# Patient Record
Sex: Female | Born: 1995 | Race: Black or African American | Hispanic: No | Marital: Single | Smoking: Never smoker
Health system: Southern US, Community
[De-identification: ages and names within clinical notes are randomized; demographics above are authoritative.]

---
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
## 2017-12-11 ENCOUNTER — Ambulatory Visit: Payer: Self-pay | Admitting: Pediatrics

## 2017-12-11 ENCOUNTER — Encounter: Payer: Self-pay | Admitting: Licensed Clinical Social Worker

## 2017-12-11 NOTE — BH Specialist Note (Deleted)
Integrated Behavioral Health Initial Visit  MRN: [id number] Name: Yolanda Riley  Number of Integrated Behavioral Health Clinician visits:: 1/6 Session Start time: ***  Session End time: *** Total time: {IBH Total Time:21014050}  Type of Service: Integrated Behavioral Health- Individual/Family Interpretor:No. Interpretor Name and Language: N/A   Warm Hand Off Completed.       SUBJECTIVE: Yolanda Riley is a 22 y.o. female accompanied by {CHL AMB ACCOMPANIED WU:[id number]}BY:[id number]} Patient was referred by *** for ***. Patient reports the following symptoms/concerns: *** Duration of problem: ***; Severity of problem: {Mild/Moderate/Severe:20260}  OBJECTIVE: Mood: {BHH MOOD:22306} and Affect: {BHH AFFECT:22307} Risk of harm to self or others: {CHL AMB BH Suicide Current Mental Status:21022748}  LIFE CONTEXT: Family and Social: *** School/Work: *** Self-Care: *** Life Changes: ***  GOALS ADDRESSED: Patient will: 1. Reduce symptoms of: {IBH Symptoms:21014056} 2. Increase knowledge and/or ability of: {IBH Patient Tools:21014057}  3. Demonstrate ability to: {IBH Goals:21014053}  INTERVENTIONS: Interventions utilized: {IBH Interventions:21014054}  Standardized Assessments completed: {IBH Screening Tools:21014051}  ASSESSMENT: Patient currently experiencing ***.   Patient may benefit from ***.  PLAN: 1. Follow up with behavioral health clinician on : *** 2. Behavioral recommendations: *** 3. Referral(s): {IBH Referrals:21014055} 4. "From scale of 1-10, how likely are you to follow plan?": ***  Tarynn Garling P Jarrid Lienhard, LCSWA

## 2020-10-14 ENCOUNTER — Other Ambulatory Visit (HOSPITAL_COMMUNITY)
Admission: RE | Admit: 2020-10-14 | Discharge: 2020-10-14 | Disposition: A | Discharge status: Home / Self Care | Payer: BC Managed Care – PPO | Source: Ambulatory Visit | Attending: Obstetrics and Gynecology | Admitting: Obstetrics and Gynecology

## 2020-10-14 ENCOUNTER — Other Ambulatory Visit: Payer: Self-pay

## 2020-10-14 ENCOUNTER — Ambulatory Visit (INDEPENDENT_AMBULATORY_CARE_PROVIDER_SITE_OTHER): Payer: BC Managed Care – PPO | Admitting: Obstetrics and Gynecology

## 2020-10-14 ENCOUNTER — Encounter: Payer: Self-pay | Admitting: Obstetrics and Gynecology

## 2020-10-14 VITALS — BP 127/88 | HR 94 | Ht 63.0 in | Wt 118.0 lb

## 2020-10-14 DIAGNOSIS — Z01419 Encounter for gynecological examination (general) (routine) without abnormal findings: Secondary | ICD-10-CM

## 2020-10-14 NOTE — Progress Notes (Signed)
GYNECOLOGY ANNUAL PREVENTATIVE CARE ENCOUNTER NOTE  History:     Yolanda Riley is a 24 y.o. G0P0000 female here for a routine annual gynecologic exam.  Current complaints: none, not sexually active.   Denies abnormal vaginal bleeding, discharge, pelvic pain, problems with intercourse or other gynecologic concerns.   Pronouns: She/her Sex with Female.   Gynecologic History Patient's last menstrual period was 10/11/2020. Contraception: none Last Pap: NA.    Obstetric History OB History  Gravida Para Term Preterm AB Living  0 0 0 0 0 0  SAB TAB Ectopic Multiple Live Births  0 0 0 0 0    History reviewed. No pertinent past medical history.  History reviewed. No pertinent surgical history.  Current Outpatient Medications on File Prior to Visit  Medication Sig Dispense Refill  . loratadine (CLARITIN) 10 MG tablet Take one tablet daily if needed for allergy symptoms and congestion (Patient not taking: Reported on 10/14/2020) 30 tablet 12   No current facility-administered medications on file prior to visit.    No Known Allergies  Social History:  reports that she is a non-smoker but has been exposed to tobacco smoke. She has never used smokeless tobacco. She reports current alcohol use. She reports current drug use. Drug: Marijuana.  Family History  Problem Relation Age of Onset  . Hypertension Mother   . Lupus Paternal Aunt   . Lupus Paternal Grandmother   . Diabetes Neg Hx   . Mental illness Neg Hx     The following portions of the patient's history were reviewed and updated as appropriate: allergies, current medications, past family history, past medical history, past social history, past surgical history and problem list.  Review of Systems Pertinent items noted in HPI and remainder of comprehensive ROS otherwise negative.  Physical Exam:  BP 127/88   Pulse 94   Ht 5\' 3"  (1.6 m)   Wt 118 lb (53.5 kg)   LMP 10/11/2020   BMI 20.90 kg/m  CONSTITUTIONAL:  Well-developed, well-nourished female in no acute distress.  HENT:  Normocephalic, atraumatic, External right and left ear normal.  EYES: Conjunctivae and EOM are normal. Pupils are equal, round, and reactive to light. No scleral icterus.  NECK: Normal range of motion, supple, no masses.  Normal thyroid.  SKIN: Skin is warm and dry. No rash noted. Not diaphoretic. No erythema. No pallor. MUSCULOSKELETAL: Normal range of motion. No tenderness.  No cyanosis, clubbing, or edema.   NEUROLOGIC: Alert and oriented to person, place, and time. Normal reflexes, muscle tone coordination.  PSYCHIATRIC: Normal mood and affect. Normal behavior. Normal judgment and thought content. CARDIOVASCULAR: Normal heart rate noted, regular rhythm RESPIRATORY: Clear to auscultation bilaterally. Effort and breath sounds normal, no problems with respiration noted. BREASTS: Symmetric in size. No masses, tenderness, skin changes, nipple drainage, or lymphadenopathy bilaterally. Performed in the presence of a chaperone. ABDOMEN: Soft, no distention noted.  No tenderness, rebound or guarding.  PELVIC: Normal appearing external genitalia and urethral meatus; normal appearing vaginal mucosa and cervix.  No abnormal discharge noted.  Pap smear obtained.  Normal uterine size, no other palpable masses, no uterine or adnexal tenderness.  Performed in the presence of a chaperone.   Assessment and Plan:    1. Women's annual routine gynecological examination  - Cytology - PAP( [facility]) - Cervicovaginal ancillary only( [facility]) - HIV antibody (with reflex) - Hepatitis C Antibody - RPR  Will follow up results of pap smear and manage accordingly. Routine preventative  health maintenance measures emphasized. Please refer to After Visit Summary for other counseling recommendations.       Dravyn Severs, Harolyn Rutherford, NP [facility] [employer], [facility]

## 2020-10-14 NOTE — Progress Notes (Signed)
NGYN pt presents for annual and all STD screening Pt reports no complaints today  Declines BC at this time  Declines flu vaccine

## 2020-10-15 LAB — CERVICOVAGINAL ANCILLARY ONLY
Bacterial Vaginitis (gardnerella): POSITIVE — AB
Candida Glabrata: NEGATIVE
Candida Vaginitis: NEGATIVE
Chlamydia: NEGATIVE
Comment: NEGATIVE
Comment: NEGATIVE
Comment: NEGATIVE
Comment: NEGATIVE
Comment: NEGATIVE
Comment: NORMAL
Neisseria Gonorrhea: NEGATIVE
Trichomonas: NEGATIVE

## 2020-10-15 LAB — CYTOLOGY - PAP: Diagnosis: NEGATIVE

## 2020-10-15 LAB — RPR: RPR Ser Ql: NONREACTIVE

## 2020-10-15 LAB — HIV ANTIBODY (ROUTINE TESTING W REFLEX): HIV Screen 4th Generation wRfx: NONREACTIVE

## 2020-10-15 LAB — HEPATITIS C ANTIBODY: Hep C Virus Ab: <0.1 s/co ratio (ref 0.0–0.9)

## 2020-10-16 ENCOUNTER — Other Ambulatory Visit: Payer: Self-pay | Admitting: Obstetrics and Gynecology

## 2020-10-16 MED ORDER — METRONIDAZOLE 500 MG PO TABS: 500.0000 mg | ORAL_TABLET | Freq: Two times a day (BID) | ORAL | 0 refills | Status: AC

## 2021-05-25 ENCOUNTER — Ambulatory Visit: Payer: PRIVATE HEALTH INSURANCE | Admitting: Nurse Practitioner

## 2021-06-09 ENCOUNTER — Ambulatory Visit (INDEPENDENT_AMBULATORY_CARE_PROVIDER_SITE_OTHER): Payer: BC Managed Care – PPO | Admitting: Women's Health

## 2021-06-09 ENCOUNTER — Encounter: Payer: Self-pay | Admitting: Women's Health

## 2021-06-09 ENCOUNTER — Other Ambulatory Visit: Payer: Self-pay

## 2021-06-09 VITALS — BP 141/79 | HR 96 | Ht 63.0 in | Wt 116.4 lb

## 2021-06-09 DIAGNOSIS — N644 Mastodynia: Secondary | ICD-10-CM | POA: Diagnosis not present

## 2021-06-09 DIAGNOSIS — N63 Unspecified lump in unspecified breast: Secondary | ICD-10-CM | POA: Diagnosis not present

## 2021-06-09 NOTE — Patient Instructions (Signed)
Breast Self-Awareness Breast self-awareness means being familiar with how your breasts look and feel. It involves checking your breasts regularly and reporting any changes to yourhealth care provider. Practicing breast self-awareness is important. Sometimes changes may not be harmful (are benign), but sometimes a change in your breasts can be a sign of a serious medical problem. It is important to learn how to do this procedure correctly so that you can catch problems early, when treatment is more likely to be successful. All women should practice breast self-awareness, including women who have hadbreast implants. What you need: A mirror. A well-lit room. How to do a breast self-exam A breast self-exam is one way to learn what is normal for your breasts andwhether your breasts are changing. To do a breast self-exam: Look for changes  Remove all the clothing above your waist. Stand in front of a mirror in a room with good lighting. Put your hands on your hips. Push your hands firmly downward. Compare your breasts in the mirror. Look for differences between them (asymmetry), such as: Differences in shape. Differences in size. Puckers, dips, and bumps in one breast and not the other. Look at each breast for changes in the skin, such as: Redness. Scaly areas. Look for changes in your nipples, such as: Discharge. Bleeding. Dimpling. Redness. A change in position.  Feel for changes Carefully feel your breasts for lumps and changes. It is best to do this while lying on your back on the floor, and again while sitting or standing in the tub or shower with soapy water on your skin. Feel each breast in the following way: Place the arm on the side of the breast you are examining above your head. Feel your breast with the other hand. Start in the nipple area and make -inch (2 cm) overlapping circles to feel your breast. Use the pads of your three middle fingers to do this. Apply light pressure,  then medium pressure, then firm pressure. The light pressure will allow you to feel the tissue closest to the skin. The medium pressure will allow you to feel the tissue that is a little deeper. The firm pressure will allow you to feel the tissue close to the ribs. Continue the overlapping circles, moving downward over the breast until you feel your ribs below your breast. Move one finger-width toward the center of the body. Continue to use the -inch (2 cm) overlapping circles to feel your breast as you move slowly up toward your collarbone. Continue the up-and-down exam using all three pressures until you reach your armpit.  Write down what you find Writing down what you find can help you remember what to discuss with your health care provider. Write down: What is normal for each breast. Any changes that you find in each breast, including: The kind of changes you find. Any pain or tenderness. Size and location of any lumps. Where you are in your menstrual cycle, if you are still menstruating. General tips and recommendations Examine your breasts every month. If you are breastfeeding, the best time to examine your breasts is after a feeding or after using a breast pump. If you menstruate, the best time to examine your breasts is 5-7 days after your period. Breasts are generally lumpier during menstrual periods, and it may be more difficult to notice changes. With time and practice, you will become more familiar with the variations in your breasts and more comfortable with the exam. Contact a health care provider if you: See a  change in the shape or size of your breasts or nipples. See a change in the skin of your breast or nipples, such as a reddened or scaly area. Have unusual discharge from your nipples. Find a lump or thick area that was not there before. Have pain in your breasts. Have any concerns related to your breast health. Summary Breast self-awareness includes looking for  physical changes in your breasts, as well as feeling for any changes within your breasts. Breast self-awareness should be performed in front of a mirror in a well-lit room. You should examine your breasts every month. If you menstruate, the best time to examine your breasts is 5-7 days after your menstrual period. Let your health care provider know of any changes you notice in your breasts, including changes in size, changes on the skin, pain or tenderness, or unusual fluid from your nipples. This information is not intended to replace advice given to you by your health care provider. Make sure you discuss any questions you have with your healthcare provider. Document Revised: 06/12/2018 Document Reviewed: 06/12/2018 Elsevier Patient Education  2022 Elsevier Inc.       Breast Tenderness Breast tenderness is a common problem for women of all ages, but may also occur in men. Breast tenderness may range from mild discomfort to severe pain. In women, the pain usually comes and goes with the menstrual cycle, but it canalso be constant. Breast tenderness has many possible causes, including hormone changes, infections, and taking certain medicines. You may have tests, such as a mammogram or an ultrasound, to check for any unusual findings. Having breasttenderness usually does not mean that you have breast cancer. Follow these instructions at home: Managing pain and discomfort  If directed, put ice to the painful area. To do this: Put ice in a plastic bag. Place a towel between your skin and the bag. Leave the ice on for 20 minutes, 2-3 times a day. Wear a supportive bra, especially during exercise. You may also want to wear a supportive bra while sleeping if your breasts are very tender.  Medicines Take over-the-counter and prescription medicines only as told by your health care provider. If the cause of your pain is infection, you may be prescribed an antibiotic medicine. If you were prescribed  an antibiotic, take it as told by your health care provider. Do not stop taking the antibiotic even if you start to feel better. Eating and drinking Your health care provider may recommend that you lessen the amount of fat in your diet. You can do this by: Limiting fried foods. Cooking foods using methods such as baking, boiling, grilling, and broiling. Decrease the amount of caffeine in your diet. Instead, drink more water and choose caffeine-free drinks. General instructions  Keep a log of the days and times when your breasts are most tender. Ask your health care provider how to do breast exams at home. This will help you notice if you have an unusual growth or lump. Keep all follow-up visits as told by your health care provider. This is important.  Contact a health care provider if: Any part of your breast is hard, red, and hot to the touch. This may be a sign of infection. You are a woman and: Not breastfeeding and you have fluid, especially blood or pus, coming out of your nipples. Have a new or painful lump in your breast that remains after your menstrual period ends. You have a fever. Your pain does not improve or it gets  worse. Your pain is interfering with your daily activities. Summary Breast tenderness may range from mild discomfort to severe pain. Breast tenderness has many possible causes, including hormone changes, infections, and taking certain medicines. It can be treated with ice, wearing a supportive bra, and medicines. Make changes to your diet if told to by your health care provider. This information is not intended to replace advice given to you by your health care provider. Make sure you discuss any questions you have with your healthcare provider. Document Revised: 03/18/2019 Document Reviewed: 03/18/2019 Elsevier Patient Education  2022 Elsevier Inc.       Breast Ultrasound Breast ultrasound, or breast ultrasonogram, is a procedure that is used to check for  breast problems. A breast ultrasound uses sound waves and a computerto create pictures of the inside of a breast. This procedure can help determine whether a lump in your breast is a fluid-filled sac (cyst), a solid tumor, or a growth. Sometimes a breast ultrasound is done to locate nodules in the breast that are too small to feel but need to be removed duringsurgery. Tell a health care provider about: Any allergies you have. All medicines you are taking, including vitamins, herbs, eye drops, creams, and over-the-counter medicines. Any blood disorders you have. Any surgeries you have had. Any medical conditions you have or have had. What are the risks? Breast ultrasound is safe and painless. Ultrasound imaging does not use X-rays.There are no known risks for this procedure. What happens before the procedure? You do not need to prepare for a breast ultrasound. You will undress from yourwaist up, so wear loose, comfortable clothing on the day of the procedure. What happens during the procedure?  You will lie down on an exam table. A health care provider will apply gel to your breast area. You may be asked to hold your arm above your head. The health care provider will move a handheld probe, which looks like a microphone, back and forth over your breast. The probe will send signals to a computer that will create images of your breast. When the procedure is over, the gel will be cleaned from your breast, and you can get dressed. What can I expect after the procedure? You can go home right away and do all of your usual activities. It is up to you to get the results of your procedure. Ask your health care provider, or the department that is doing the procedure, when your results will be ready. Summary A breast ultrasound is a procedure that is used to check for breast problems. This procedure uses sound waves and a computer to create pictures of the inside of your breast. You can go home right  away and do all of your usual activities. Make sure you know how to get the results of your procedure. This information is not intended to replace advice given to you by your health care provider. Make sure you discuss any questions you have with your healthcare provider. Document Revised: 06/17/2019 Document Reviewed: 06/17/2019 Elsevier Patient Education  2022 [employer].

## 2021-06-09 NOTE — Progress Notes (Signed)
  History:  [name] is a 25 y.o. G0P0000 who presents to clinic today for breast pain and swelling. Patient reports for a few days before her last period, her right breast only became larger than normal and tender. The left breast was unaffected. The problem is not present today. Patient reports she has not had another period since then, but does state the problem has not returned. Patient did not take any medication to treat the problem, and does not have any active concerns today, but reports this has never happened to her before or since, so she wanted to get checked out.   The following portions of the patient's history were reviewed and updated as appropriate: allergies, current medications, family history, past medical history, social history, past surgical history and problem list.  Review of Systems:  Review of Systems  All other systems reviewed and are negative.   Objective:  Physical Exam BP (!) 141/79   Pulse 96   Ht 5\' 3"  (1.6 m)   Wt 116 lb 6.4 oz (52.8 kg)   LMP 05/26/2021   BMI 20.62 kg/m   Physical Exam Vitals and nursing note reviewed. Exam conducted with a chaperone present.  Constitutional:      General: She is not in acute distress.    Appearance: Normal appearance. She is not ill-appearing, toxic-appearing or diaphoretic.  HENT:     Head: Normocephalic and atraumatic.  Pulmonary:     Effort: Pulmonary effort is normal.  Chest:  Breasts:    Right: Normal. No swelling, bleeding, inverted nipple, mass, nipple discharge, skin change or tenderness.     Left: Normal. No swelling, bleeding, inverted nipple, mass, nipple discharge, skin change or tenderness.  Neurological:     Mental Status: She is alert and oriented to person, place, and time.  Psychiatric:        Mood and Affect: Mood normal.        Behavior: Behavior normal.        Thought Content: Thought content normal.        Judgment: Judgment normal.   Labs and Imaging No results found for  this or any previous visit (from the past 24 hour(s)).  No results found.  Health Maintenance Due  Topic Date Due   INFLUENZA VACCINE  06/07/2021   Assessment & Plan:  1. Breast pain, right - 08/07/2021 BREAST COMPLETE UNI RIGHT INC AXILLA; Future  2. Breast swelling - US BREAST COMPLETE UNI RIGHT INC AXILLA; Future  Approximately 10 minutes of total time was spent with this patient on counseling and exam  Ranon Coven, [place], NP 06/09/2021 3:10 PM

## 2021-06-09 NOTE — Progress Notes (Signed)
Pt is in the office complaining of r sided breast pain that started before her last cycle on 05-26-21. Pt reports that she usually has breast pain before her cycle but the right breast was more swollen than usual. Last pap 10-14-20

## 2021-11-05 ENCOUNTER — Other Ambulatory Visit: Payer: PRIVATE HEALTH INSURANCE

## 2021-11-08 ENCOUNTER — Other Ambulatory Visit: Payer: Self-pay

## 2021-11-08 ENCOUNTER — Emergency Department (HOSPITAL_BASED_OUTPATIENT_CLINIC_OR_DEPARTMENT_OTHER): Payer: PRIVATE HEALTH INSURANCE | Admitting: Radiology

## 2021-11-08 DIAGNOSIS — W228XXA Striking against or struck by other objects, initial encounter: Secondary | ICD-10-CM | POA: Diagnosis not present

## 2021-11-08 DIAGNOSIS — S99922A Unspecified injury of left foot, initial encounter: Secondary | ICD-10-CM | POA: Diagnosis present

## 2021-11-08 DIAGNOSIS — H1031 Unspecified acute conjunctivitis, right eye: Secondary | ICD-10-CM | POA: Insufficient documentation

## 2021-11-08 DIAGNOSIS — Y990 Civilian activity done for income or pay: Secondary | ICD-10-CM | POA: Diagnosis not present

## 2021-11-08 DIAGNOSIS — S93602A Unspecified sprain of left foot, initial encounter: Secondary | ICD-10-CM | POA: Diagnosis not present

## 2021-11-08 DIAGNOSIS — B9689 Other specified bacterial agents as the cause of diseases classified elsewhere: Secondary | ICD-10-CM | POA: Insufficient documentation

## 2021-11-08 NOTE — ED Triage Notes (Signed)
Patient reports to the ER for left knee and toe pain. Patient reports she hit her foot on a weight. Patient reports she recently had bacterial conjunctivitis in her right eye.

## 2021-11-09 ENCOUNTER — Emergency Department (HOSPITAL_BASED_OUTPATIENT_CLINIC_OR_DEPARTMENT_OTHER)
Admission: EM | Admit: 2021-11-09 | Discharge: 2021-11-09 | Disposition: A | Discharge status: Home / Self Care | Payer: PRIVATE HEALTH INSURANCE | Attending: Emergency Medicine | Admitting: Emergency Medicine

## 2021-11-09 DIAGNOSIS — S93602A Unspecified sprain of left foot, initial encounter: Secondary | ICD-10-CM

## 2021-11-09 DIAGNOSIS — H1031 Unspecified acute conjunctivitis, right eye: Secondary | ICD-10-CM

## 2021-11-09 MED ORDER — ERYTHROMYCIN 5 MG/GM OP OINT
TOPICAL_OINTMENT | Freq: Three times a day (TID) | OPHTHALMIC | Status: DC
  Filled 2021-11-09: qty 3.5

## 2021-11-09 NOTE — ED Provider Notes (Signed)
Advised patient prior to discharge that she needs to have follow-up on her blood pressure.  Patient and mother agreeable w/plan   Zadie Rhine, MD 11/09/21 [id number]

## 2021-11-09 NOTE — ED Provider Notes (Signed)
[facility] EMERGENCY DEPT Provider Note   CSN: [id number] Arrival date & time: 11/08/21  2218     History  Chief Complaint  Patient presents with   Toe Pain   Knee Pain    Yolanda Riley is a 26 y.o. female.   Toe Pain This is a new problem. The problem occurs constantly. The problem has been gradually worsening. The symptoms are aggravated by walking. The symptoms are relieved by rest.  Knee Pain Associated symptoms: no fever   Patient presents for multiple complaints.  She reports she accidentally kicked a weight while working out several days ago.  She had pain immediately in her left toe and foot.  Since that time she is now having pain in her ankle and knee.  No other trauma.  No falls.  Patient also reports she may have an eye infection.  She reports she has had cough, congestion and sore throat since around Christmas.  She is also been having eye discharge and mild pain.  No fevers or vomiting.  She has had multiple sick contacts during the holidays    Home Medications Prior to Admission medications   Medication Sig Start Date End Date Taking? Authorizing Provider  loratadine (CLARITIN) 10 MG tablet Take one tablet daily if needed for allergy symptoms and congestion Patient not taking: No sig reported 12/08/16   Maree Erie, MD      Allergies    Patient has no known allergies.    Review of Systems   Review of Systems  Constitutional:  Negative for fever.  HENT:  Positive for congestion.   Eyes:  Positive for discharge.  Musculoskeletal:  Positive for arthralgias.   Physical Exam Updated Vital Signs BP (!) 147/97 (BP Location: Right Arm)    Pulse 99    Temp 97.7 F (36.5 C)    Resp 20    Ht 1.6 m (5\' 3" )    Wt 59 kg    LMP 10/26/2021 (Exact Date)    SpO2 100%    BMI 23.03 kg/m  Physical Exam CONSTITUTIONAL: Well developed/well nourished HEAD: Normocephalic/atraumatic EYES: EOMI/PERRL, mild conjunctival erythema on the right.  No  discharge.  No photophobia. ENMT: Mucous membranes moist NECK: supple no meningeal signs CV: S1/S2 noted, no murmurs/rubs/gallops noted LUNGS: Lungs are clear to auscultation bilaterally, no apparent distress NEURO: Pt is awake/alert/appropriate, moves all extremitiesx4.  No facial droop.   EXTREMITIES: pulses normal/equal, full ROM, mild tenderness noted left foot.  No deformities.  Distal pulses intact.  No lower extremity edema.  Full range of motion of all extremities. All other extremities/joints palpated/ranged and nontender SKIN: warm, color normal PSYCH: no abnormalities of mood noted, alert and oriented to situation  ED Results / Procedures / Treatments   Labs (all labs ordered are listed, but only abnormal results are displayed) Labs Reviewed - No data to display  EKG None  Radiology DG Knee Complete 4 Views Left  Result Date: 11/08/2021 CLINICAL DATA:  Left knee and toe pain. EXAM: LEFT KNEE - COMPLETE 4+ VIEW COMPARISON:  None. FINDINGS: No evidence of fracture, dislocation, or joint effusion. Normal alignment. Normal joint spaces. No evidence of arthropathy or other focal bone abnormality. Soft tissues are unremarkable. IMPRESSION: Negative radiographs of the left knee. Electronically Signed   By: 01/06/2022 M.D.   On: 11/08/2021 23:27   DG Foot Complete Left  Result Date: 11/08/2021 CLINICAL DATA:  Foot injury. EXAM: LEFT FOOT - COMPLETE 3+ VIEW COMPARISON:  None.  FINDINGS: There is no evidence of fracture or dislocation. There is no evidence of arthropathy or other focal bone abnormality. Soft tissues are unremarkable. IMPRESSION: Negative. Electronically Signed   By: Darliss Cheney M.D.   On: 11/08/2021 23:30    Procedures Procedures    Medications Ordered in ED Medications  erythromycin ophthalmic ointment ( Right Eye Given 11/09/21 0555)    ED Course/ Medical Decision Making/ A&P                           Medical Decision Making  Placed in postop shoe.   X-rays were personally reviewed by myself and are negative for acute fracture  Will place on erythromycin ointment for her likely conjunctivitis She denies any visual change Patient is very well-appearing       Final Clinical Impression(s) / ED Diagnoses Final diagnoses:  Acute bacterial conjunctivitis of right eye  Foot sprain, left, initial encounter    Rx / DC Orders ED Discharge Orders     None         Zadie Rhine, MD 11/09/21 [phone]

## 2021-11-10 ENCOUNTER — Telehealth: Payer: Self-pay

## 2021-11-10 DIAGNOSIS — Z09 Encounter for follow-up examination after completed treatment for conditions other than malignant neoplasm: Secondary | ICD-10-CM

## 2021-11-10 NOTE — Telephone Encounter (Signed)
SWCM mailed pt dismissal letter due to age and adolescent transition policy for [facility]. Pt has not been seen in [facility] clinic since 2018. Pt needs to choose and schedule new pt appt with an adult primary care provider of her choosing. Pt can call SWCM if assistance is needed.    Kenn File, BSW, QP Case Manager [facility] Office: [phone] Direct Number: [phone]

## 2022-03-07 ENCOUNTER — Ambulatory Visit: Payer: Self-pay | Admitting: Advanced Practice Midwife

## 2022-03-07 NOTE — Progress Notes (Deleted)
   Subjective:     Yolanda Riley is a 26 y.o. female here at [facility] *** for a routine exam.  Current complaints: ***.  Personal health questionnaire reviewed: {yes/no:9010}.  Do you have a primary care provider? *** Do you feel safe at home? ***  Flowsheet Row Office Visit from 10/09/2014 in [facility]  PHQ-2 Total Score 0       Health Maintenance Due  Topic Date Due   TETANUS/TDAP  06/25/2020     Risk factors for chronic health problems: Smoking: Alchohol/how much: Pt BMI: There is no height or weight on file to calculate BMI.   Gynecologic History No LMP recorded. Contraception: {method:5051} Last Pap: ***. Results were: {norm/abn:16337} Last mammogram: ***. Results were: {norm/abn:16337}  Obstetric History OB History  Gravida Para Term Preterm AB Living  0 0 0 0 0 0  SAB IAB Ectopic Multiple Live Births  0 0 0 0 0     {Common ambulatory SmartLinks:19316}  Review of Systems {ros; complete:30496}    Objective:   There were no vitals taken for this visit. VS reviewed, nursing note reviewed,  Constitutional: well developed, well nourished, no distress HEENT: normocephalic CV: normal rate Pulm/chest wall: normal effort Breast Exam:  ***Deferred with low risks and shared decision making, discussed recommendation to start mammogram between 40-50 yo/ exam performed: right breast normal without mass, skin or nipple changes or axillary nodes, left breast normal without mass, skin or nipple changes or axillary nodes Abdomen: soft Neuro: alert and oriented x 3 Skin: warm, dry Psych: affect normal Pelvic exam: ***Deferred/ Performed: Cervix pink, visually closed, without lesion, scant white creamy discharge, vaginal walls and external genitalia normal Bimanual exam: Cervix 0/long/high, firm, anterior, neg CMT, uterus nontender, nonenlarged, adnexa without tenderness, enlargement, or mass       Assessment/Plan:    There are no diagnoses linked to this encounter.     No follow-ups on file.   Sharen Counter, CNM 8:13 AM

## 2022-04-12 ENCOUNTER — Other Ambulatory Visit: Payer: Self-pay

## 2022-04-12 ENCOUNTER — Ambulatory Visit (HOSPITAL_COMMUNITY)
Admission: EM | Admit: 2022-04-12 | Discharge: 2022-04-12 | Disposition: A | Discharge status: Home / Self Care | Payer: BC Managed Care – PPO | Attending: Emergency Medicine | Admitting: Emergency Medicine

## 2022-04-12 ENCOUNTER — Encounter (HOSPITAL_COMMUNITY): Payer: Self-pay | Admitting: *Deleted

## 2022-04-12 DIAGNOSIS — Z20822 Contact with and (suspected) exposure to covid-19: Secondary | ICD-10-CM | POA: Diagnosis not present

## 2022-04-12 DIAGNOSIS — J069 Acute upper respiratory infection, unspecified: Secondary | ICD-10-CM | POA: Insufficient documentation

## 2022-04-12 LAB — POCT RAPID STREP A, ED / UC: Streptococcus, Group A Screen (Direct): NEGATIVE

## 2022-04-12 NOTE — Discharge Instructions (Addendum)
Your symptoms today are most likely being caused by a virus and should steadily improve in time it can take up to 7 to 10 days before you truly start to see a turnaround however things will get better  Strep test is negative, it has been sent to the lab to determine if bacterial will grow, if this occurs you will be notified and antibiotics sent in at that time  COVID test is pending, you will be notified of positive test results only    You can take Tylenol and/or Ibuprofen as needed for fever reduction and pain relief.   For cough: honey 1/2 to 1 teaspoon (you can dilute the honey in water or another fluid).  You can also use guaifenesin and dextromethorphan for cough. You can use a humidifier for chest congestion and cough.  If you don't have a humidifier, you can sit in the bathroom with the hot shower running.      For sore throat: try warm salt water gargles, cepacol lozenges, throat spray, warm tea or water with lemon/honey, popsicles or ice, or OTC cold relief medicine for throat discomfort.   For congestion: take a daily anti-histamine like Zyrtec, Claritin, and a oral decongestant, such as pseudoephedrine.  You can also use Flonase 1-2 sprays in each nostril daily.   It is important to stay hydrated: drink plenty of fluids (water, gatorade/powerade/pedialyte, juices, or teas) to keep your throat moisturized and help further relieve irritation/discomfort.'

## 2022-04-12 NOTE — ED Provider Notes (Signed)
[facility]    CSN: [id number] Arrival date & time: 04/12/22  1910      History   Chief Complaint Chief Complaint  Patient presents with   Sore Throat   Fever   Emesis    HPI Yolanda Riley is a 26 y.o. female.   Patient presents with fever, chills, nasal congestion, bilateral ear pain, rhinorrhea, sore throat and body aches for 3 days.  No known sick contacts.  Has not attempted treatment of symptoms.  Tolerating food and liquids.  Denies cough, shortness of breath or wheezing.  History reviewed. No pertinent past medical history.  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 No pertinent surgical history.  OB History     Gravida  0   Para  0   Term  0   Preterm  0   AB  0   Living  0      SAB  0   IAB  0   Ectopic  0   Multiple  0   Live Births  0            Home Medications    Prior to Admission medications   Medication Sig Start Date End Date Taking? Authorizing Provider  loratadine (CLARITIN) 10 MG tablet Take one tablet daily if needed for allergy symptoms and congestion Patient not taking: No sig reported 12/08/16   Maree Erie, MD    Family History Family History  Problem Relation Age of Onset   Hypertension Mother    Lupus Paternal Grandmother    Lupus Paternal Aunt    Diabetes Neg Hx    Mental illness Neg Hx     Social History Social History   Tobacco Use   Smoking status: Never    Passive exposure: Yes   Smokeless tobacco: Never  Substance Use Topics   Alcohol use: Yes    Alcohol/week: 0.0 standard drinks   Drug use: Yes    Types: Marijuana     Allergies   Patient has no known allergies.   Review of Systems Review of Systems  Constitutional:  Positive for chills and fever. Negative for activity change, appetite change, diaphoresis, fatigue and unexpected weight change.  HENT:  Positive for congestion, ear pain, rhinorrhea  and sore throat. Negative for dental problem, drooling, ear discharge, facial swelling, hearing loss, mouth sores, nosebleeds, postnasal drip, sinus pressure, sinus pain, sneezing, tinnitus, trouble swallowing and voice change.   Respiratory:  Negative for apnea, cough, choking, chest tightness, shortness of breath, wheezing and stridor.   Cardiovascular: Negative.   Gastrointestinal:  Negative for abdominal distention, abdominal pain, anal bleeding, blood in stool, constipation, diarrhea, nausea, rectal pain and vomiting.  Musculoskeletal:  Positive for myalgias. Negative for arthralgias, back pain, gait problem, joint swelling, neck pain and neck stiffness.  Skin: Negative.     Physical Exam Triage Vital Signs ED Triage Vitals  Enc Vitals Group     BP 04/12/22 1926 131/80     Pulse Rate 04/12/22 1926 (!) 10     Resp 04/12/22 1926 18     Temp 04/12/22 1926 98.3 F (36.8 C)     Temp src --      SpO2 04/12/22 1926 98 %     Weight --      Height --      Head Circumference --      Peak Flow --  Pain Score 04/12/22 1924 4     Pain Loc --      Pain Edu? --      Excl. in GC? --    No data found.  Updated Vital Signs BP 131/80   Pulse (!) 10   Temp 98.3 F (36.8 C)   Resp 18   LMP 03/30/2022   SpO2 98%   Visual Acuity Right Eye Distance:   Left Eye Distance:   Bilateral Distance:    Right Eye Near:   Left Eye Near:    Bilateral Near:     Physical Exam Constitutional:      Appearance: Normal appearance.  HENT:     Head: Normocephalic.     Right Ear: Tympanic membrane, ear canal and external ear normal.     Left Ear: Tympanic membrane, ear canal and external ear normal.     Nose: Congestion and rhinorrhea present.     Mouth/Throat:     Mouth: Mucous membranes are moist.     Pharynx: Oropharynx is clear.     Tonsils: No tonsillar exudate. 0 on the right. 0 on the left.  Eyes:     Extraocular Movements: Extraocular movements intact.  Cardiovascular:     Rate  and Rhythm: Normal rate and regular rhythm.     Pulses: Normal pulses.     Heart sounds: Normal heart sounds.  Pulmonary:     Effort: Pulmonary effort is normal.     Breath sounds: Normal breath sounds.  Musculoskeletal:     Cervical back: Normal range of motion and neck supple.  Skin:    General: Skin is warm and dry.  Neurological:     Mental Status: She is alert and oriented to person, place, and time. Mental status is at baseline.  Psychiatric:        Mood and Affect: Mood normal.        Behavior: Behavior normal.      UC Treatments / Results  Labs (all labs ordered are listed, but only abnormal results are displayed) Labs Reviewed - No data to display  EKG   Radiology No results found.  Procedures Procedures (including critical care time)  Medications Ordered in UC Medications - No data to display  Initial Impression / Assessment and Plan / UC Course  I have reviewed the triage vital signs and the nursing notes.  Pertinent labs & imaging results that were available during my care of the patient were reviewed by me and considered in my medical decision making (see chart for details).  Viral URI  Vital signs are stable, patient is in no signs of distress, rapid strep test negative, COVID test pending, patient feels she is able to manage with over-the-counter medications, encouraged her to do so, may follow-up with his urgent care as needed Final Clinical Impressions(s) / UC Diagnoses   Final diagnoses:  None   Discharge Instructions   None    ED Prescriptions   None    PDMP not reviewed this encounter.   Valinda Hoar, NP 04/15/22 1101

## 2022-04-12 NOTE — ED Triage Notes (Signed)
Pt report feeling sick for the past few days. Pt reprts chills,night sweats,sore throat and N/V.

## 2022-04-13 LAB — SARS CORONAVIRUS 2 (TAT 6-24 HRS): SARS Coronavirus 2: NEGATIVE

## 2022-04-24 ENCOUNTER — Ambulatory Visit (HOSPITAL_COMMUNITY)
Admission: EM | Admit: 2022-04-24 | Discharge: 2022-04-24 | Disposition: A | Discharge status: Home / Self Care | Payer: PRIVATE HEALTH INSURANCE | Attending: Nurse Practitioner | Admitting: Nurse Practitioner

## 2022-04-24 ENCOUNTER — Encounter (HOSPITAL_COMMUNITY): Payer: Self-pay

## 2022-04-24 DIAGNOSIS — R058 Other specified cough: Secondary | ICD-10-CM | POA: Diagnosis not present

## 2022-04-24 MED ORDER — BENZONATATE 100 MG PO CAPS: 100.0000 mg | ORAL_CAPSULE | Freq: Three times a day (TID) | ORAL | 0 refills | Status: DC

## 2022-04-24 MED ORDER — FLUTICASONE PROPIONATE 50 MCG/ACT NA SUSP: 1.0000 | Freq: Every day | NASAL | 0 refills | Status: DC

## 2022-04-24 MED ORDER — PROMETHAZINE-DM 6.25-15 MG/5ML PO SYRP: 5.0000 mL | ORAL_SOLUTION | Freq: Every evening | ORAL | 0 refills | Status: DC | PRN

## 2022-04-24 NOTE — Discharge Instructions (Addendum)
-   Please start the cough perles during the day to help the dry cough - You can use cough syrup at night time to help with dry cough  - Start saline nasal rinses to help prevent post nasal drainage - Start flonase nasal spray; use this after saline spray  - Seek care if your cough continues for longer than 6 weeks or if it worsens

## 2022-04-24 NOTE — ED Provider Notes (Signed)
[facility]    CSN: [id number] Arrival date & time: 04/24/22  1452      History   Chief Complaint Chief Complaint  Patient presents with   Cough    HPI Yolanda Riley is a 26 y.o. female.   Patient presents with cough that has been ongoing for the past 10 to 14 days.  Reports initially, she had a sore throat with significant congestion, fevers, body aches.  Cough started and at first was more congested.  Now, the cough is more dry in nature, congested at times.  She denies wheezing, shortness of breath, chest pain or tightness.  Nasal congestion has improved significantly as well as runny nose.  Does think she may be having some postnasal drainage.  Denies hemoptysis, fevers, night sweats, unexplained weight loss, heartburn.  Has treating cough with over-the-counter medications without relief.      History reviewed. No pertinent past medical history.  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 No pertinent surgical history.  OB History     Gravida  0   Para  0   Term  0   Preterm  0   AB  0   Living  0      SAB  0   IAB  0   Ectopic  0   Multiple  0   Live Births  0            Home Medications    Prior to Admission medications   Medication Sig Start Date End Date Taking? Authorizing Provider  benzonatate (TESSALON) 100 MG capsule Take 1 capsule (100 mg total) by mouth every 8 (eight) hours. Do not take while driving or operating heavy machinery 04/24/22  Yes Cathlean Marseilles A, NP  fluticasone (FLONASE) 50 MCG/ACT nasal spray Place 1 spray into both nostrils daily. 04/24/22  Yes Valentino Nose, NP  promethazine-dextromethorphan (PROMETHAZINE-DM) 6.25-15 MG/5ML syrup Take 5 mLs by mouth at bedtime as needed for cough. Do not take while driving or operating heavy machinery 04/24/22  Yes Valentino Nose, NP  loratadine (CLARITIN) 10 MG tablet Take one tablet daily  if needed for allergy symptoms and congestion Patient not taking: No sig reported 12/08/16   Maree Erie, MD    Family History Family History  Problem Relation Age of Onset   Hypertension Mother    Lupus Paternal Grandmother    Lupus Paternal Aunt    Diabetes Neg Hx    Mental illness Neg Hx     Social History Social History   Tobacco Use   Smoking status: Never    Passive exposure: Yes   Smokeless tobacco: Never  Substance Use Topics   Alcohol use: Yes    Alcohol/week: 0.0 standard drinks of alcohol   Drug use: Yes    Types: Marijuana     Allergies   Patient has no known allergies.   Review of Systems Review of Systems Per HPI  Physical Exam Triage Vital Signs ED Triage Vitals  Enc Vitals Group     BP 04/24/22 1551 127/83     Pulse Rate 04/24/22 1551 97     Resp 04/24/22 1551 18     Temp 04/24/22 1551 98.6 F (37 C)     Temp Source 04/24/22 1551 Oral     SpO2 04/24/22 1551 100 %     Weight --      Height --  Head Circumference --      Peak Flow --      Pain Score 04/24/22 1550 0     Pain Loc --      Pain Edu? --      Excl. in GC? --    No data found.  Updated Vital Signs BP 127/83 (BP Location: Left Arm)   Pulse 97   Temp 98.6 F (37 C) (Oral)   Resp 18   LMP 03/30/2022   SpO2 100%   Visual Acuity Right Eye Distance:   Left Eye Distance:   Bilateral Distance:    Right Eye Near:   Left Eye Near:    Bilateral Near:     Physical Exam Vitals and nursing note reviewed.  Constitutional:      General: She is not in acute distress.    Appearance: Normal appearance. She is not ill-appearing or toxic-appearing.  HENT:     Head: Normocephalic and atraumatic.     Right Ear: Tympanic membrane, ear canal and external ear normal.     Left Ear: Tympanic membrane, ear canal and external ear normal.     Nose: Congestion present. No rhinorrhea.     Mouth/Throat:     Mouth: Mucous membranes are moist.     Pharynx: Oropharynx is clear.  Posterior oropharyngeal erythema present. No oropharyngeal exudate.     Comments: Cobblestoning posterior pharynx Eyes:     General: No scleral icterus.    Extraocular Movements: Extraocular movements intact.  Cardiovascular:     Rate and Rhythm: Normal rate and regular rhythm.  Pulmonary:     Effort: Pulmonary effort is normal. No respiratory distress.     Breath sounds: Normal breath sounds. No wheezing, rhonchi or rales.  Abdominal:     General: Abdomen is flat. Bowel sounds are normal. There is no distension.     Palpations: Abdomen is soft.  Musculoskeletal:     Cervical back: Normal range of motion and neck supple.  Lymphadenopathy:     Cervical: No cervical adenopathy.  Skin:    General: Skin is warm and dry.     Coloration: Skin is not jaundiced or pale.     Findings: No erythema or rash.  Neurological:     Mental Status: She is alert and oriented to person, place, and time.     Motor: No weakness.  Psychiatric:        Mood and Affect: Mood normal.        Behavior: Behavior normal.      UC Treatments / Results  Labs (all labs ordered are listed, but only abnormal results are displayed) Labs Reviewed - No data to display  EKG   Radiology No results found.  Procedures Procedures (including critical care time)  Medications Ordered in UC Medications - No data to display  Initial Impression / Assessment and Plan / UC Course  I have reviewed the triage vital signs and the nursing notes.  Pertinent labs & imaging results that were available during my care of the patient were reviewed by me and considered in my medical decision making (see chart for details).    Very pleasant and well-appearing 26 year old female presents with acute cough.  Patient's symptoms and examination are consistent with postviral cough.  Treat with cough suppressant, saline nasal rinses to prevent postnasal drainage, and Flonase nasal spray.  No indication for chest x-ray today, this was  discussed with the patient.  I encouraged her to seek care if her symptoms persist  longer than 4 to 6 weeks or if they worsen.  The patient was given the opportunity to ask questions.  All questions answered to their satisfaction.  The patient is in agreement to this plan.   Final Clinical Impressions(s) / UC Diagnoses   Final diagnoses:  Post-viral cough syndrome     Discharge Instructions      - Please start the cough perles during the day to help the dry cough - You can use cough syrup at night time to help with dry cough  - Start saline nasal rinses to help prevent post nasal drainage - Start flonase nasal spray; use this after saline spray  - Seek care if your cough continues for longer than 6 weeks or if it worsens    ED Prescriptions     Medication Sig Dispense Auth. Provider   benzonatate (TESSALON) 100 MG capsule Take 1 capsule (100 mg total) by mouth every 8 (eight) hours. Do not take while driving or operating heavy machinery 21 capsule Cathlean Marseilles A, NP   promethazine-dextromethorphan (PROMETHAZINE-DM) 6.25-15 MG/5ML syrup Take 5 mLs by mouth at bedtime as needed for cough. Do not take while driving or operating heavy machinery 118 mL Cathlean Marseilles A, NP   fluticasone (FLONASE) 50 MCG/ACT nasal spray Place 1 spray into both nostrils daily. 16 g Valentino Nose, NP      PDMP not reviewed this encounter.   Valentino Nose, NP 04/24/22 1615

## 2022-04-24 NOTE — ED Triage Notes (Deleted)
Patient presents to Urgent Care with complaints of abd pain, nausea, ha, low back pain since a few months ago. Patient reports regular bowel movements and she has been taking medication for acid reflex.  Pt requesting Covid and bacterial infection test Pt reports sore throat and coughing for few weeks.

## 2022-04-24 NOTE — ED Triage Notes (Signed)
Pt presents with chronic non productive cough X 2 weeks.

## 2022-08-30 IMAGING — DX DG KNEE COMPLETE 4+V*L*
4 series · 4 of 4 positions shown · non-contrast
Comparison: None.

CLINICAL DATA: Left knee and toe pain.

EXAM:
LEFT KNEE - COMPLETE 4+ VIEW

[knee ap]
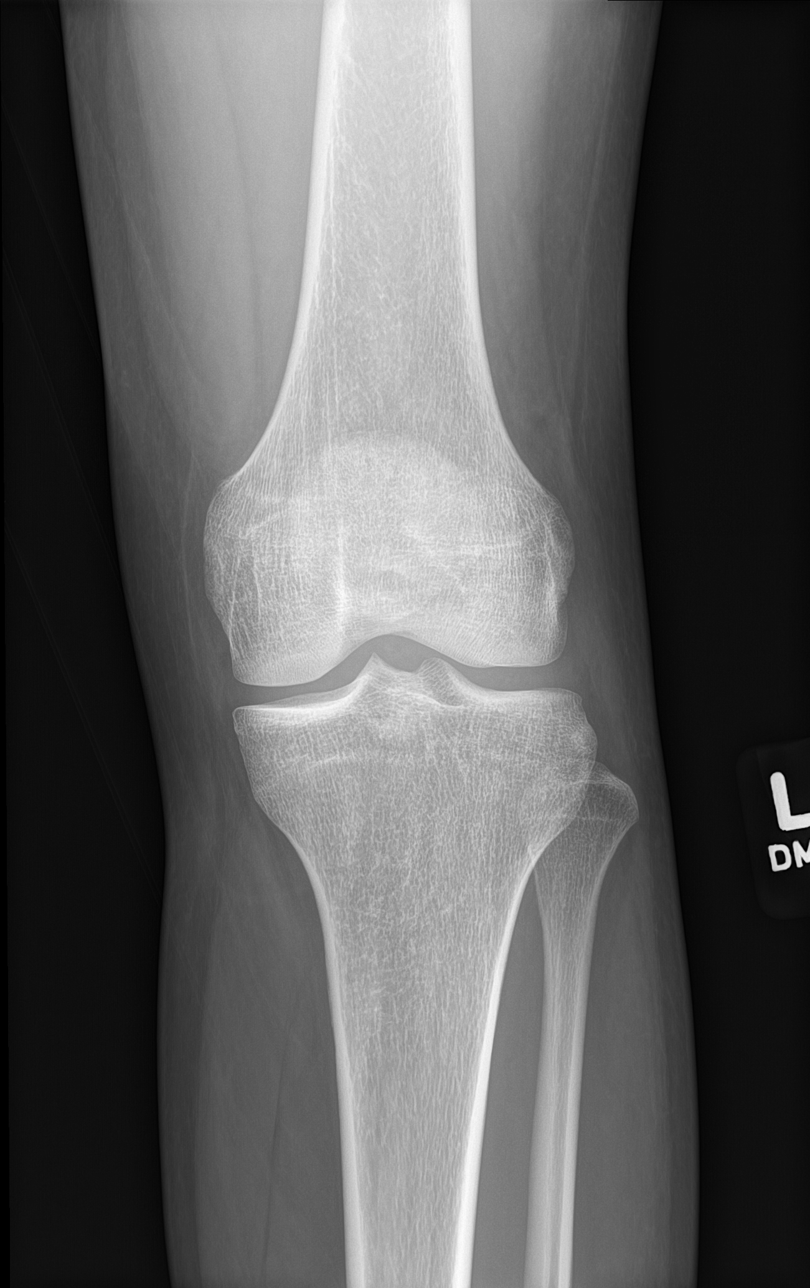

[knee obl (1 of 2)]
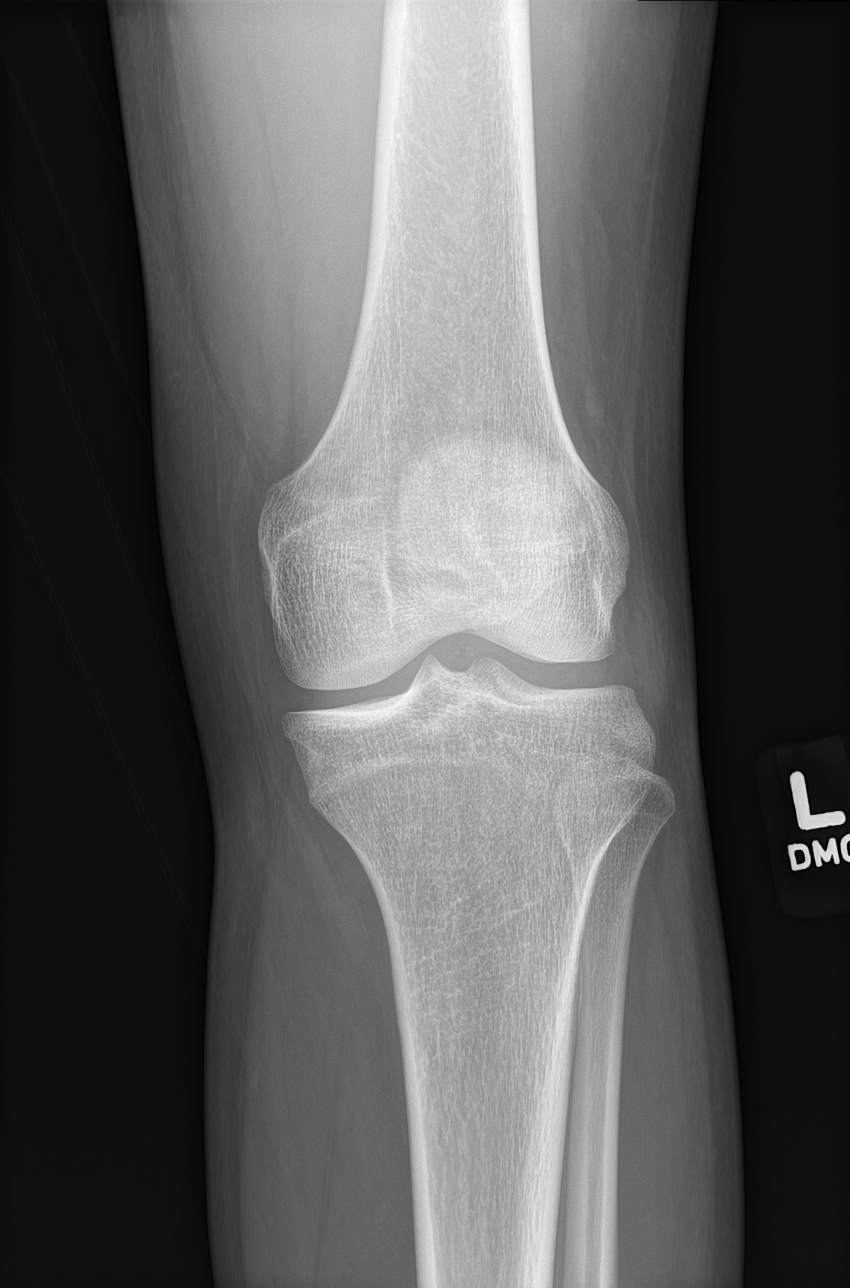

[knee obl (2 of 2)]
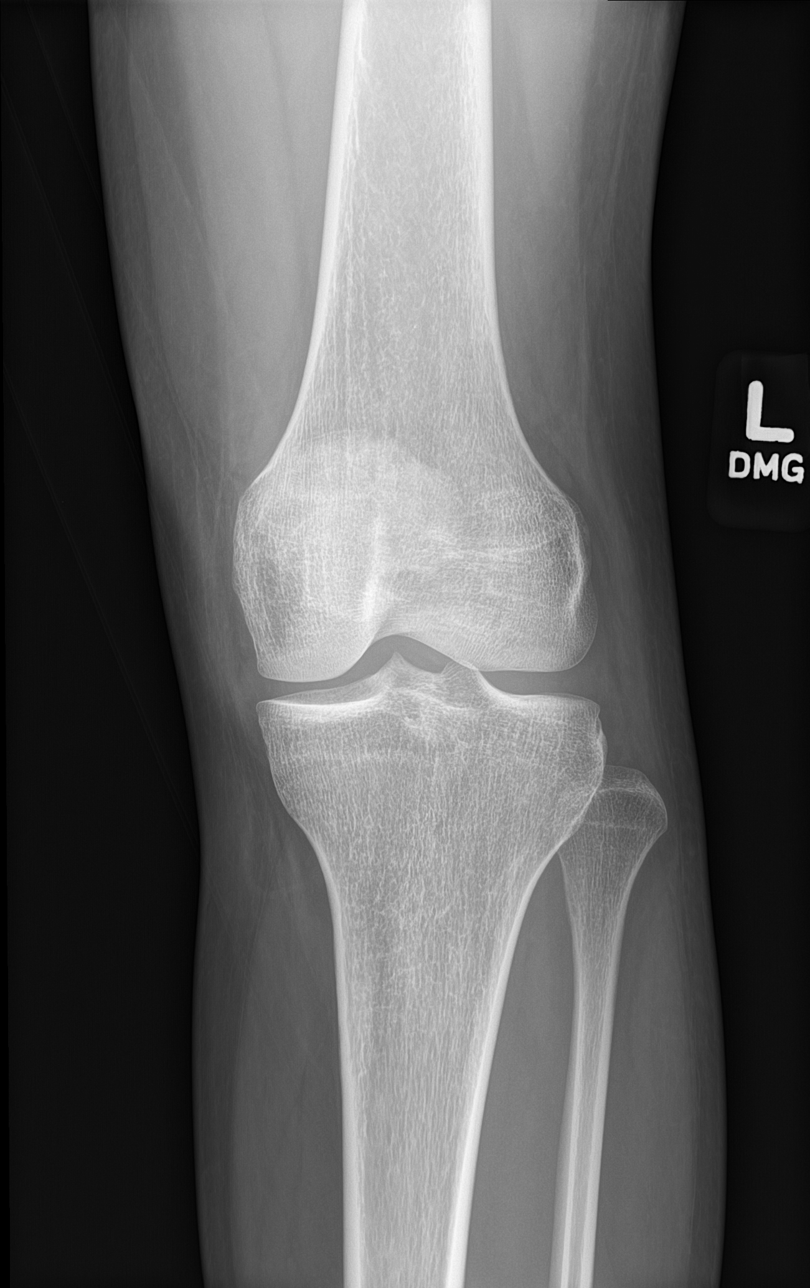

[knee lat]
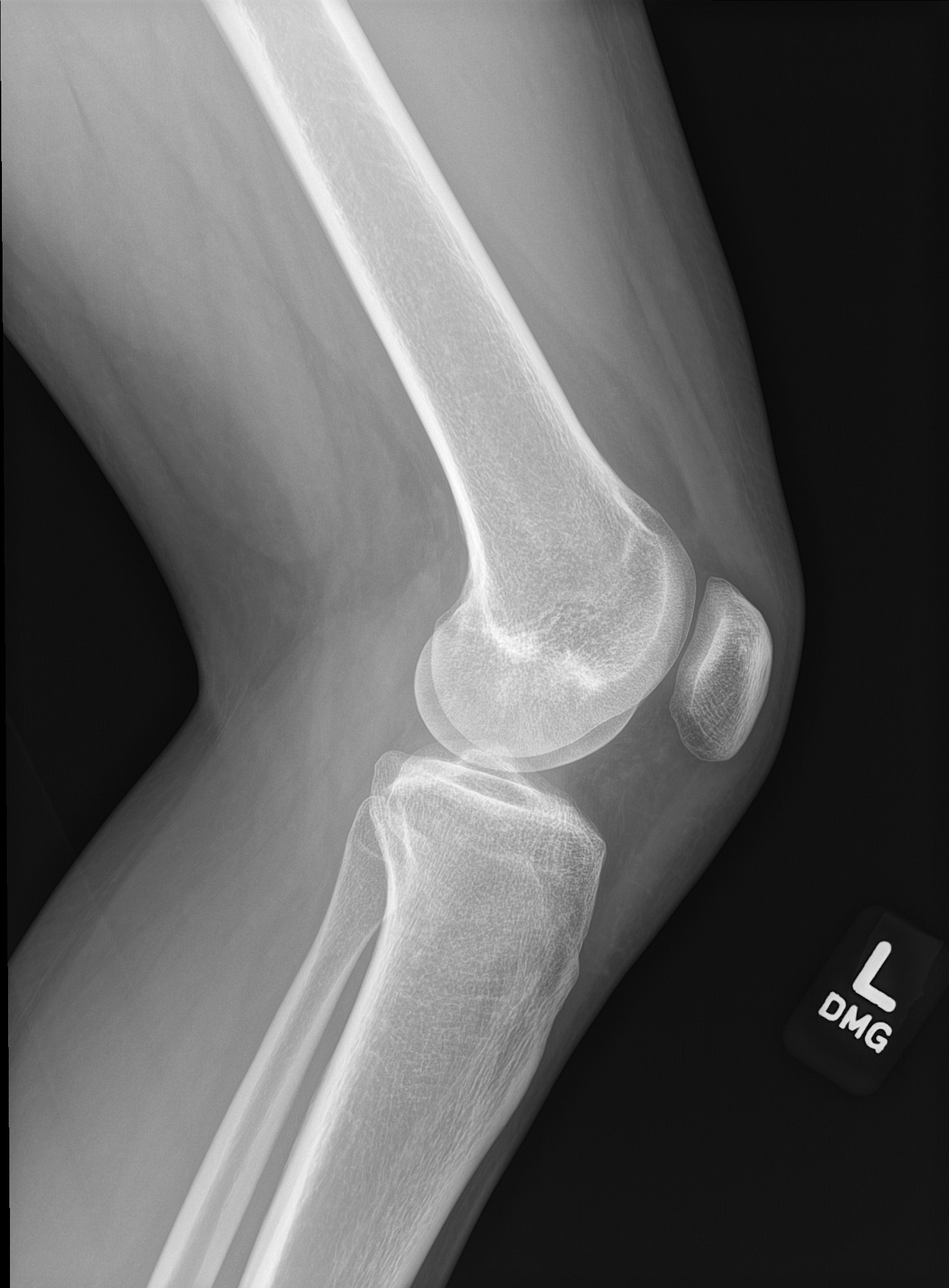

[4 of 4 positions shown; findings below may reference images not displayed]

FINDINGS: No evidence of fracture, dislocation, or joint effusion. Normal
alignment. Normal joint spaces. No evidence of arthropathy or other
focal bone abnormality. Soft tissues are unremarkable.
IMPRESSION: Negative radiographs of the left knee.

## 2022-12-02 ENCOUNTER — Telehealth: Payer: BC Managed Care – PPO | Admitting: Nurse Practitioner

## 2022-12-02 DIAGNOSIS — H1032 Unspecified acute conjunctivitis, left eye: Secondary | ICD-10-CM | POA: Diagnosis not present

## 2022-12-02 MED ORDER — OFLOXACIN 0.3 % OP SOLN: 1.0000 [drp] | Freq: Four times a day (QID) | OPHTHALMIC | 0 refills | Status: AC

## 2022-12-02 NOTE — Patient Instructions (Addendum)
[email or web address]  New patients schedule online  Choose family medicine  Choose locations that you like   Asked reason establish new doctor

## 2022-12-02 NOTE — Progress Notes (Signed)
Virtual Visit Consent   Yolanda Riley, you are scheduled for a virtual visit with a [facility] provider today. Just as with appointments in the office, your consent must be obtained to participate. Your consent will be active for this visit and any virtual visit you may have with one of our providers in the next 365 days. If you have a MyChart account, a copy of this consent can be sent to you electronically.  As this is a virtual visit, video technology does not allow for your provider to perform a traditional examination. This may limit your provider's ability to fully assess your condition. If your provider identifies any concerns that need to be evaluated in person or the need to arrange testing (such as labs, EKG, etc.), we will make arrangements to do so. Although advances in technology are sophisticated, we cannot ensure that it will always work on either your end or our end. If the connection with a video visit is poor, the visit may have to be switched to a telephone visit. With either a video or telephone visit, we are not always able to ensure that we have a secure connection.  By engaging in this virtual visit, you consent to the provision of healthcare and authorize for your insurance to be billed (if applicable) for the services provided during this visit. Depending on your insurance coverage, you may receive a charge related to this service.  I need to obtain your verbal consent now. Are you willing to proceed with your visit today? CARESSE SEDIVY has provided verbal consent on 12/02/2022 for a virtual visit (video or telephone). Apolonio Schneiders, FNP  Date: 12/02/2022 8:17 AM  Virtual Visit via Video Note   I, Apolonio Schneiders, connected with  Yolanda Riley  ([id number], [birth year]) on 12/02/22 at  8:15 AM EST by a video-enabled telemedicine application and verified that I am speaking with the correct person using two identifiers.  Location: Patient: Virtual Visit Location  Patient: Home Provider: Virtual Visit Location Provider: Home Office   I discussed the limitations of evaluation and management by telemedicine and the availability of in person appointments. The patient expressed understanding and agreed to proceed.    History of Present Illness: Yolanda Riley is a 27 y.o. who identifies as a female who was assigned female at birth, and is being seen today for left eye drainage.  Two weeks ago she was outdoors and it was windy and she remembers her eye getting irritated after that. 4-5 days ago she started have purulent drainage from her left eye. She has been using warm compresses for relief. She has had this happed to her in the past 2 years ago occurred in both eyes and she was given an antibiotic ointment for relief   She still had that and has been using it without relief.   She had some mild URI symptoms last week.   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 Do not take while driving or operating heavy machinery, Disp: 21 capsule, Rfl: 0   fluticasone (FLONASE) 50 MCG/ACT nasal spray, Place 1 spray into both nostrils daily., Disp: 16 g, Rfl: 0   loratadine (CLARITIN) 10  MG tablet, Take one tablet daily if needed for allergy symptoms and congestion (Patient not taking: No sig reported), Disp: 30 tablet, Rfl: 12   promethazine-dextromethorphan (PROMETHAZINE-DM) 6.25-15 MG/5ML syrup, Take 5 mLs by mouth at bedtime as needed for cough. Do not take while driving or operating heavy machinery, Disp: 118 mL, Rfl: 0  Observations/Objective: Patient is well-developed, well-nourished in no acute distress.  Resting comfortably  at home.  Head is normocephalic, atraumatic.  No  labored breathing.  Speech is clear and coherent with logical content.  Patient is alert and oriented at baseline.  Left eye with purulent drainage from inner corner with mild erythema noted no edema to lids   Assessment and Plan:  1. Acute bacterial conjunctivitis of left eye  - ofloxacin (OCUFLOX) 0.3 % ophthalmic solution; Place 1 drop into the left eye 4 (four) times daily for 7 days.  Dispense: 5 mL; Refill: 0    Follow Up Instructions: I discussed the assessment and treatment plan with the patient. The patient was provided an opportunity to ask questions and all were answered. The patient agreed with the plan and demonstrated an understanding of the instructions.  A copy of instructions were sent to the patient via MyChart unless otherwise noted below.    The patient was advised to call back or seek an in-person evaluation if the symptoms worsen or if the condition fails to improve as anticipated.  Time:  I spent 10 minutes with the patient via telehealth technology discussing the above problems/concerns.    Apolonio Schneiders, FNP

## 2022-12-13 ENCOUNTER — Ambulatory Visit: Payer: PRIVATE HEALTH INSURANCE | Admitting: Family

## 2022-12-23 ENCOUNTER — Encounter: Payer: Self-pay | Admitting: Family

## 2022-12-23 ENCOUNTER — Ambulatory Visit: Payer: BC Managed Care – PPO | Admitting: Family

## 2022-12-23 VITALS — BP 129/87 | HR 101 | Temp 98.0°F | Ht 63.0 in | Wt 116.0 lb

## 2022-12-23 DIAGNOSIS — H5789 Other specified disorders of eye and adnexa: Secondary | ICD-10-CM | POA: Diagnosis not present

## 2022-12-23 DIAGNOSIS — M25561 Pain in right knee: Secondary | ICD-10-CM | POA: Diagnosis not present

## 2022-12-23 NOTE — Progress Notes (Signed)
New Patient Office Visit  Subjective:  Patient ID: Yolanda Riley, female    DOB: Apr 08, 1996  Age: 27 y.o. MRN: [id number]  CC:  Chief Complaint  Patient presents with   New Patient (Initial Visit)   Eye Drainage    Pt c/o eye drainage for about a month off and on. Has tried eye drops which does help but causes watering.    Knee Pain    Pt c/o right knee pain and discomfort since falling on knee in November.     HPI Yolanda Riley presents for establishing care today.  Eye drainage:  had a virtual acute visit online a month ago and given Ofloxacin eye drops which helped, but then she will occasionally have the pussy eye drainage or just a lot of eye watering, denies any pain, irritation or itching in the eye. No change in vision.  Knee pain:  fell while roller skating back in Nov, she scraped her knee and it bruised with mild swelling, she was able to walk on it. But states since that time when sitting and not using her knee she will feel twinges of pain and was not sure if that was normal.  Assessment & Plan:   Problem List Items Addressed This Visit   None Visit Diagnoses     Eye drainage    -  Primary eye looks good today, no noted drainage or injection; has used abt eye drops with some relief, but sx returned, advised to see eye doctor to have better assessed as to whether drainage due to dry eye or conjunctivitis. OK to continue to use abt drops prn, also can try OTC generic Systane eye drops for dry eye to rule this out.    Right knee pain, unspecified chronicity    - injured 3 mos ago, still has dark discoloration, but is non-tender, no decreased ROM in knee or pain with walking or standing. Advised to apply heat and/or analgesic creams tid prn to help with circulation/healing. Call back if random pain remains in another month or so.   Subjective:    Outpatient Medications Prior to Visit  Medication Sig Dispense Refill   benzonatate (TESSALON) 100 MG capsule Take  1 capsule (100 mg total) by mouth every 8 (eight) hours. Do not take while driving or operating heavy machinery 21 capsule 0   fluticasone (FLONASE) 50 MCG/ACT nasal spray Place 1 spray into both nostrils daily. 16 g 0   loratadine (CLARITIN) 10 MG tablet Take one tablet daily if needed for allergy symptoms and congestion (Patient not taking: No sig reported) 30 tablet 12   promethazine-dextromethorphan (PROMETHAZINE-DM) 6.25-15 MG/5ML syrup Take 5 mLs by mouth at bedtime as needed for cough. Do not take while driving or operating heavy machinery 118 mL 0   No facility-administered medications prior to visit.   History reviewed. No pertinent past medical history. History reviewed. No pertinent surgical history.  Objective:   Today's Vitals: BP 129/87 (BP Location: Left Arm, Patient Position: Sitting, Cuff Size: Large)   Pulse (!) 101   Temp 98 F (36.7 C) (Temporal)   Ht 5' 3"$  (1.6 m)   Wt 116 lb (52.6 kg)   LMP 12/11/2022 (Exact Date)   SpO2 100%   BMI 20.55 kg/m   Physical Exam Vitals and nursing note reviewed.  Constitutional:      Appearance: Normal appearance.  Eyes:     General: Lids are normal. No allergic shiner.       Left  eye: No foreign body or discharge.     Extraocular Movements:     Right eye: Normal extraocular motion.     Left eye: Normal extraocular motion.     Conjunctiva/sclera:     Right eye: Right conjunctiva is not injected. No exudate.    Left eye: Left conjunctiva is not injected. No exudate. Cardiovascular:     Rate and Rhythm: Normal rate and regular rhythm.  Pulmonary:     Effort: Pulmonary effort is normal.     Breath sounds: Normal breath sounds.  Musculoskeletal:        General: Normal range of motion.     Left knee: Ecchymosis (dark discoloration from injury, no induration noted, no guarding with palpation) present. No swelling, bony tenderness or crepitus. Normal range of motion. No tenderness.       Legs:  Skin:    General: Skin is warm  and dry.  Neurological:     Mental Status: She is alert.  Psychiatric:        Mood and Affect: Mood normal.        Behavior: Behavior normal.     No orders of the defined types were placed in this encounter.   Jeanie Sewer, NP

## 2022-12-23 NOTE — Patient Instructions (Addendum)
Welcome to [facility], It was a pleasure meeting you today!    As discussed, I recommend scheduling with an eye doctor to have your eye assessed in more detail to see if your drainage is d/t dry eye or some type of conjunctivitis. You can try over the counter eye drops that say for "Dry eye" e.g. Systane, or generic and see if this helps prior to seeing the eye doctor.  Let me know if your knee continues to have pain. Try over the counter analgesic creams - rub a small amount on several times per day, and you can also apply heat for 15-20 minutes 3 times per day to help with healing & blood flow.  Recommend scheduling a physical with fasting labs at your convenience.    PLEASE NOTE: If you had any LAB tests please let us know if you have not heard back within a few days. You may see your results on MyChart before we have a chance to review them but we will give you a call once they are reviewed by [place]. If we ordered any REFERRALS today, please let us know if you have not heard from their office within the next week.  Let us know through MyChart if you are needing REFILLS, or have your pharmacy send [place] the request. You can also use MyChart to communicate with me or any office staff.  Please try these tips to maintain a healthy lifestyle: It is important that you exercise regularly at least 30 minutes 5 times a week. Think about what you will eat, plan ahead. Choose whole foods, & think  "clean, green, fresh or frozen" over canned, processed or packaged foods which are more sugary, salty, and fatty. 70 to 75% of food eaten should be fresh vegetables and protein. 2-3  meals daily with healthy snacks between meals, but must be whole fruit, protein or vegetables. Aim to eat over a 10 hour period when you are active, for example, 7am to 5pm, and then STOP after your last meal of the day, drinking only water.  Shorter eating windows, 6-8 hours, are showing benefits in  heart disease and blood sugar regulation. Drink water every day! Shoot for 64 ounces daily = 8 cups, no other drink is as healthy! Fruit juice is best enjoyed in a healthy way, by EATING the fruit.

## 2023-03-09 ENCOUNTER — Encounter: Payer: BC Managed Care – PPO | Admitting: Family

## 2023-03-29 ENCOUNTER — Telehealth: Payer: Self-pay | Admitting: Family

## 2023-03-29 NOTE — Telephone Encounter (Signed)
Ok with me if ok with [name]!

## 2023-03-29 NOTE — Telephone Encounter (Signed)
Pt has requested to transfer care from [facility] to Dr. Casimiro Needle. Is this transfer okay with both providers?

## 2023-04-07 NOTE — Telephone Encounter (Signed)
See above. Ok to close task

## 2023-04-25 ENCOUNTER — Encounter: Payer: BC Managed Care – PPO | Admitting: Family Medicine

## 2023-05-18 ENCOUNTER — Ambulatory Visit: Payer: BC Managed Care – PPO | Admitting: Obstetrics and Gynecology

## 2023-06-30 ENCOUNTER — Ambulatory Visit: Payer: BC Managed Care – PPO | Admitting: Family Medicine

## 2023-07-06 ENCOUNTER — Ambulatory Visit: Payer: BC Managed Care – PPO | Admitting: Obstetrics and Gynecology

## 2023-08-18 ENCOUNTER — Encounter: Payer: Self-pay | Admitting: Obstetrics and Gynecology

## 2023-08-18 ENCOUNTER — Ambulatory Visit: Payer: BC Managed Care – PPO | Admitting: Obstetrics and Gynecology

## 2023-08-18 ENCOUNTER — Other Ambulatory Visit (HOSPITAL_COMMUNITY)
Admission: RE | Admit: 2023-08-18 | Discharge: 2023-08-18 | Disposition: A | Discharge status: Home / Self Care | Payer: BC Managed Care – PPO | Source: Ambulatory Visit | Attending: Obstetrics and Gynecology | Admitting: Obstetrics and Gynecology

## 2023-08-18 VITALS — BP 122/81 | HR 91 | Ht 63.0 in | Wt 118.0 lb

## 2023-08-18 DIAGNOSIS — Z01419 Encounter for gynecological examination (general) (routine) without abnormal findings: Secondary | ICD-10-CM

## 2023-08-18 DIAGNOSIS — Z124 Encounter for screening for malignant neoplasm of cervix: Secondary | ICD-10-CM | POA: Diagnosis present

## 2023-08-18 DIAGNOSIS — Z1151 Encounter for screening for human papillomavirus (HPV): Secondary | ICD-10-CM | POA: Diagnosis not present

## 2023-08-18 DIAGNOSIS — Z3009 Encounter for other general counseling and advice on contraception: Secondary | ICD-10-CM | POA: Diagnosis not present

## 2023-08-18 DIAGNOSIS — Z7689 Persons encountering health services in other specified circumstances: Secondary | ICD-10-CM

## 2023-08-18 DIAGNOSIS — Z1339 Encounter for screening examination for other mental health and behavioral disorders: Secondary | ICD-10-CM | POA: Diagnosis not present

## 2023-08-18 DIAGNOSIS — Z113 Encounter for screening for infections with a predominantly sexual mode of transmission: Secondary | ICD-10-CM

## 2023-08-18 DIAGNOSIS — N898 Other specified noninflammatory disorders of vagina: Secondary | ICD-10-CM | POA: Insufficient documentation

## 2023-08-18 NOTE — Patient Instructions (Signed)
Use the following websites (and others) to help learn more about your contraception options and find the method that is right for you!  - The Centers for Disease Control (CDC) website: https://www.cdc.gov/reproductivehealth/contraception/index.htm  - [facility] website: https://www.plannedparenthood.org/learn/birth-control  - [employer]org: https://www.bedsider.org/methods  

## 2023-08-18 NOTE — Progress Notes (Signed)
Pt states she occ has vaginal odor and itching, would like all std screening today.   PHQ= 15 GAD= 13 [facility] info given to pt today.

## 2023-08-18 NOTE — Progress Notes (Signed)
GYNECOLOGY ANNUAL PREVENTATIVE CARE ENCOUNTER NOTE  Subjective:   IDELIA CAUDELL is a 27 y.o. G0P0000 female here for a annual gynecologic exam. Current complaints: wants to make sure she is not pregnant, requesting a pregnancy test. Had LMP 08/02/23 and had two negative tests last month. She felt like she was having some pregnancy symptoms over the summer and period was abnormal after those symptoms, has had negative test since. Just wants to check.  Periods are monthly, heavy x 2 days then better, 6 days overall.     Denies abnormal vaginal bleeding, discharge, pelvic pain, problems with intercourse or other gynecologic concerns. Requests STI screen.   Gynecologic History Patient's last menstrual period was 08/02/2023. Contraception: none Last Pap: 2021. Results: negative Last mammogram: n/a. Gardisil: has not received  Obstetric History OB History  Gravida Para Term Preterm AB Living  0 0 0 0 0 0  SAB IAB Ectopic Multiple Live Births  0 0 0 0 0    History reviewed. No pertinent past medical history.  History reviewed. No pertinent surgical history.  No current outpatient medications on file prior to visit.   No current facility-administered medications on file prior to visit.    No Known Allergies  Social History   Socioeconomic History   Marital status: Single    Spouse name: Not on file   Number of children: Not on file   Years of education: Not on file   Highest education level: Not on file  Occupational History   Not on file  Tobacco Use   Smoking status: Never    Passive exposure: Yes   Smokeless tobacco: Never  Vaping Use   Vaping status: Never Used  Substance and Sexual Activity   Alcohol use: Yes    Alcohol/week: 1.0 standard drink of alcohol    Types: 1 Standard drinks or equivalent per week   Drug use: Yes    Types: Marijuana   Sexual activity: Yes    Partners: Male    Birth control/protection: None  Other Topics Concern   Not on file   Social History Narrative   Not on file   Social Determinants of Health   Financial Resource Strain: Not on file  Food Insecurity: Not on file  Transportation Needs: Not on file  Physical Activity: Not on file  Stress: Not on file  Social Connections: Not on file  Intimate Partner Violence: Not on file    Family History  Problem Relation Age of Onset   Hypertension Mother    Lupus Paternal Grandmother    Lupus Paternal Aunt    Diabetes Neg Hx    Mental illness Neg Hx      The following portions of the patient's history were reviewed and updated as appropriate: allergies, current medications, past family history, past medical history, past social history, past surgical history and problem list.  Review of Systems Pertinent items are noted in HPI.   Objective:  BP 122/81   Pulse 91   Ht 5\' 3"  (1.6 m)   Wt 53.5 kg   LMP 08/02/2023   BMI 20.90 kg/m  CONSTITUTIONAL: Well-developed, well-nourished female in no acute distress.  HENT:  Normocephalic, atraumatic, External right and left ear normal. Oropharynx is clear and moist EYES: Conjunctivae and EOM are normal. Pupils are equal, round, and reactive to light. No scleral icterus.  NECK: Normal range of motion, supple, no masses.  Normal thyroid.  SKIN: Skin is warm and dry. No rash noted. Not  diaphoretic. No erythema. No pallor. NEUROLOGIC: Alert and oriented to person, place, and time. Normal reflexes, muscle tone coordination. No cranial nerve deficit noted. PSYCHIATRIC: Normal mood and affect. Normal behavior. Normal judgment and thought content. CARDIOVASCULAR: Normal heart rate noted, regular rhythm RESPIRATORY: Clear to auscultation bilaterally. Effort and breath sounds normal, no problems with respiration noted. BREASTS: Symmetric in size. No masses, skin changes, nipple drainage, or lymphadenopathy. ABDOMEN: Soft, no distention noted.  No tenderness, rebound or guarding.  PELVIC: Normal appearing external genitalia;  normal appearing vaginal mucosa and cervix.  No abnormal discharge noted.  Pap smear obtained. Pelvic cultures obtained. Normal uterine size, no other palpable masses, no uterine or adnexal tenderness. MUSCULOSKELETAL: Normal range of motion. No tenderness.  No cyanosis, clubbing, or edema.   Exam done with chaperone present.  Assessment and Plan:   1. Well woman exam - Pregnancy, urine  2. Vaginal discharge - Cervicovaginal ancillary only( [place])  3. Routine screening for STI (sexually transmitted infection) - Cervicovaginal ancillary only( [place]) - RPR - HIV antibody (with reflex) - Hepatitis C Antibody - Hepatitis B Surface AntiGEN  4. Encounter for counseling regarding contraception Reviewed options for birth control including oral contraceptive pills (combination and progesterone only), NuvaRing, Depo-Provera, Nexplanon, IUDs (copper and levonorgestrol). Thoroughly reviewed risks/benefits/side effects of each. Answered all questions. Patient with h/o of n/a and opts to think about it.  5. Cervical cancer screening - Cytology - PAP( [place])  6. Encounter to establish care Wants to discuss screening for autism - Ambulatory referral to [facility]  Will follow up results of pap smear/STI screen and manage accordingly. Encouraged improvement in diet and exercise.  Requests STI screen. COVID vaccine  Mammogram n/a Referral for colonoscopy n/a Flu vaccine declines Gardisil declined  Routine preventative health maintenance measures emphasized. Please refer to After Visit Summary for other counseling recommendations.     Baldemar Lenis, MD, [facility] Attending Center for [facility])

## 2023-08-19 LAB — HIV ANTIBODY (ROUTINE TESTING W REFLEX): HIV Screen 4th Generation wRfx: NONREACTIVE

## 2023-08-19 LAB — RPR: RPR Ser Ql: NONREACTIVE

## 2023-08-19 LAB — HEPATITIS B SURFACE ANTIGEN: Hepatitis B Surface Ag: NEGATIVE

## 2023-08-19 LAB — HEPATITIS C ANTIBODY: Hep C Virus Ab: NONREACTIVE

## 2023-08-20 LAB — PREGNANCY, URINE: Preg Test, Ur: NEGATIVE

## 2023-08-21 LAB — CYTOLOGY - PAP
Comment: NEGATIVE
Diagnosis: NEGATIVE
High risk HPV: NEGATIVE

## 2023-08-21 LAB — CERVICOVAGINAL ANCILLARY ONLY
Bacterial Vaginitis (gardnerella): POSITIVE — AB
Candida Glabrata: NEGATIVE
Candida Vaginitis: NEGATIVE
Chlamydia: NEGATIVE
Comment: NEGATIVE
Comment: NEGATIVE
Comment: NEGATIVE
Comment: NEGATIVE
Comment: NEGATIVE
Comment: NORMAL
Neisseria Gonorrhea: NEGATIVE
Trichomonas: NEGATIVE

## 2023-08-22 MED ORDER — METRONIDAZOLE 500 MG PO TABS
500.0000 mg | ORAL_TABLET | Freq: Two times a day (BID) | ORAL | 0 refills | Status: AC
Start: 2023-08-22 — End: 2023-08-29

## 2023-08-22 NOTE — Addendum Note (Signed)
Addended by: Leroy Libman on: 08/22/2023 03:00 PM   Modules accepted: Orders

## 2024-04-28 ENCOUNTER — Encounter (HOSPITAL_COMMUNITY): Payer: Self-pay

## 2024-04-28 ENCOUNTER — Ambulatory Visit (HOSPITAL_COMMUNITY)
Admission: EM | Admit: 2024-04-28 | Discharge: 2024-04-28 | Disposition: A | Discharge status: Home / Self Care | Attending: Emergency Medicine | Admitting: Emergency Medicine

## 2024-04-28 DIAGNOSIS — S76911A Strain of unspecified muscles, fascia and tendons at thigh level, right thigh, initial encounter: Secondary | ICD-10-CM | POA: Diagnosis not present

## 2024-04-28 MED ORDER — NAPROXEN 375 MG PO TABS: 375.0000 mg | ORAL_TABLET | Freq: Two times a day (BID) | ORAL | 0 refills | Status: AC

## 2024-04-28 NOTE — ED Provider Notes (Signed)
 [facility]    CSN: [id number] Arrival date & time: 04/28/24  1304      History   Chief Complaint Chief Complaint  Patient presents with   Leg Pain    HPI Yolanda Riley is a 28 y.o. female.   Patient presents with right hip and thigh pain x 3 days.  Patient denies any recent falls or injuries that she knows of.  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  Patient states that she felt like she may have pulled something in her thigh.   Patient states that she has tried some stretching and applied Tiger balm with some relief.  Patient states the pain was worse this morning.  Patient denies difficulty ambulating due to pain.  The history is provided by the patient and medical records.  Leg Pain   History reviewed. No pertinent past medical history.  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 No pertinent surgical history.  OB History     Gravida  0   Para  0   Term  0   Preterm  0   AB  0   Living  0      SAB  0   IAB  0   Ectopic  0   Multiple  0   Live Births  0            Home Medications    Prior to Admission medications   Medication Sig Start Date End Date Taking? Authorizing Provider  naproxen (NAPROSYN) 375 MG tablet Take 1 tablet (375 mg total) by mouth 2 (two) times daily. 04/28/24  Yes Johnie Rumaldo LABOR, NP    Family History Family History  Problem Relation Age of Onset   Hypertension Mother    Lupus Paternal Grandmother    Lupus Paternal Aunt    Diabetes Neg Hx    Mental illness Neg Hx     Social History Social History   Tobacco Use   Smoking status: Never    Passive exposure: Yes   Smokeless tobacco: Never  Vaping Use   Vaping status: Never Used  Substance Use Topics   Alcohol use: Yes    Alcohol/week: 1.0 standard drink of alcohol    Types: 1 Standard drinks  or equivalent per week    Comment: Rare   Drug use: Yes    Types: Marijuana     Allergies   Patient has no known allergies.   Review of Systems Review of Systems  Per HPI  Physical Exam Triage Vital Signs ED Triage Vitals  Encounter Vitals Group     BP 04/28/24 1348 129/85     Girls Systolic BP Percentile --      Girls Diastolic BP Percentile --      Boys Systolic BP Percentile --      Boys Diastolic BP Percentile --      Pulse Rate 04/28/24 1348 81     Resp 04/28/24 1348 16     Temp 04/28/24 1348 98.6 F (37 C)     Temp Source 04/28/24 1348 Oral     SpO2 04/28/24 1348 98 %     Weight --      Height --      Head Circumference --      Peak Flow --  Pain Score 04/28/24 1346 6     Pain Loc --      Pain Education --      Exclude from Growth Chart --    No data found.  Updated Vital Signs BP 129/85 (BP Location: Right Arm)   Pulse 81   Temp 98.6 F (37 C) (Oral)   Resp 16   LMP 04/27/2024 (Exact Date)   SpO2 98%   Visual Acuity Right Eye Distance:   Left Eye Distance:   Bilateral Distance:    Right Eye Near:   Left Eye Near:    Bilateral Near:     Physical Exam Vitals and nursing note reviewed.  Constitutional:      General: She is awake. She is not in acute distress.    Appearance: Normal appearance. She is well-developed and well-groomed. She is not ill-appearing.   Musculoskeletal:     Right hip: Normal. No tenderness. Normal range of motion.     Right upper leg: Normal. No swelling, edema, deformity, tenderness or bony tenderness.     Right knee: Normal.   Neurological:     Mental Status: She is alert.     Motor: Motor function is intact.     Gait: Gait is intact.   Psychiatric:        Behavior: Behavior is cooperative.      UC Treatments / Results  Labs (all labs ordered are listed, but only abnormal results are displayed) Labs Reviewed - No data to display  EKG   Radiology No results found.  Procedures Procedures  (including critical care time)  Medications Ordered in UC Medications - No data to display  Initial Impression / Assessment and Plan / UC Course  I have reviewed the triage vital signs and the nursing notes.  Pertinent labs & imaging results that were available during my care of the patient were reviewed by me and considered in my medical decision making (see chart for details).     Patient is well-appearing.  Vitals are stable.  Upon assessment there is no tenderness, swelling, obvious deformity, or decreased range of motion noted to the right hip or upper leg.  Patient ambulates without difficulty.  Discussed pain is likely related to a muscle strain.  Prescribed naproxen as needed for pain.  Given orthopedic follow-up.  Discussed follow-up and return precautions. Final Clinical Impressions(s) / UC Diagnoses   Final diagnoses:  Muscle strain of right thigh, initial encounter     Discharge Instructions      As discussed it is likely that you have strained a muscle in your right thigh. Take naproxen twice daily as needed for pain. You can take 650 mg of Tylenol every 6-8 hours as needed for additional pain relief. Apply heat to this area to help with pain, and continue to do some gentle stretching. Rest and elevate your leg as often as possible. You can follow-up with [facility] if your pain continues for further evaluation and management. Otherwise follow-up with your primary care provider or return here as needed.    ED Prescriptions     Medication Sig Dispense Auth. Provider   naproxen (NAPROSYN) 375 MG tablet Take 1 tablet (375 mg total) by mouth 2 (two) times daily. 20 tablet Johnie Flaming A, NP      PDMP not reviewed this encounter.   Johnie Flaming A, NP 04/28/24 [phone]

## 2024-04-28 NOTE — ED Triage Notes (Signed)
 Patient here today with c/o right hamstring pain X 3 days. Patient states that the pain started in her hip. Patient tried stretching with some relief. Patient states that it fel worse this morning.

## 2024-04-28 NOTE — Discharge Instructions (Addendum)
 As discussed it is likely that you have strained a muscle in your right thigh. Take naproxen twice daily as needed for pain. You can take 650 mg of Tylenol every 6-8 hours as needed for additional pain relief. Apply heat to this area to help with pain, and continue to do some gentle stretching. Rest and elevate your leg as often as possible. You can follow-up with [facility] if your pain continues for further evaluation and management. Otherwise follow-up with your primary care provider or return here as needed.

## 2024-11-11 ENCOUNTER — Encounter

## 2024-11-11 ENCOUNTER — Telehealth: Admitting: Emergency Medicine

## 2024-11-11 DIAGNOSIS — J208 Acute bronchitis due to other specified organisms: Secondary | ICD-10-CM | POA: Diagnosis not present

## 2024-11-11 DIAGNOSIS — B9689 Other specified bacterial agents as the cause of diseases classified elsewhere: Secondary | ICD-10-CM | POA: Diagnosis not present

## 2024-11-11 MED ORDER — PREDNISONE 10 MG (21) PO TBPK: ORAL_TABLET | ORAL | 0 refills | Status: AC

## 2024-11-11 MED ORDER — AZITHROMYCIN 250 MG PO TABS: ORAL_TABLET | ORAL | 0 refills | Status: AC

## 2024-11-11 MED ORDER — BENZONATATE 100 MG PO CAPS: 100.0000 mg | ORAL_CAPSULE | Freq: Two times a day (BID) | ORAL | 0 refills | Status: AC | PRN

## 2024-11-11 NOTE — Progress Notes (Signed)
 We are sorry that you are not feeling well.  Here is how we plan to help!  Based on your presentation I believe you most likely have A cough due to bacteria.  When patients have a fever and a productive cough with a change in color or increased sputum production, we are concerned about bacterial bronchitis.  If left untreated it can progress to pneumonia.  If your symptoms do not improve with your treatment plan it is important that you contact your provider.   I have prescribed Azithromyin 250 mg: two tablets now and then one tablet daily for 4 additonal days    In addition you may use A prescription cough medication called Tessalon  Perles 100mg . You may take 1-2 capsules every 8 hours as needed for your cough.  I have also prescribed prednisone  for you. Please take it per package instructions.   From your responses in the eVisit questionnaire you describe inflammation in the upper respiratory tract which is causing a significant cough.  This is commonly called Bronchitis and has four common causes:   Allergies Viral Infections Acid Reflux Bacterial Infection Allergies, viruses and acid reflux are treated by controlling symptoms or eliminating the cause. An example might be a cough caused by taking certain blood pressure medications. You stop the cough by changing the medication. Another example might be a cough caused by acid reflux. Controlling the reflux helps control the cough.  USE OF BRONCHODILATOR (RESCUE) INHALERS: There is a risk from using your bronchodilator too frequently.  The risk is that over-reliance on a medication which only relaxes the muscles surrounding the breathing tubes can reduce the effectiveness of medications prescribed to reduce swelling and congestion of the tubes themselves.  Although you feel brief relief from the bronchodilator inhaler, your asthma may actually be worsening with the tubes becoming more swollen and filled with mucus.  This can delay other crucial  treatments, such as oral steroid medications. If you need to use a bronchodilator inhaler daily, several times per day, you should discuss this with your provider.  There are probably better treatments that could be used to keep your asthma under control.     HOME CARE Only take medications as instructed by your medical team. Complete the entire course of an antibiotic. Drink plenty of fluids and get plenty of rest. Avoid close contacts especially the very young and the elderly Cover your mouth if you cough or cough into your sleeve. Always remember to wash your hands A steam or ultrasonic humidifier can help congestion.   GET HELP RIGHT AWAY IF: You develop worsening fever. You become short of breath You cough up blood. Your symptoms persist after you have completed your treatment plan MAKE SURE YOU  Understand these instructions. Will watch your condition. Will get help right away if you are not doing well or get worse.  Your e-visit answers were reviewed by a board certified advanced clinical practitioner to complete your personal care plan.  Depending on the condition, your plan could have included both over the counter or prescription medications. If there is a problem please reply  once you have received a response from your provider. Your safety is important to us .  If you have drug allergies check your prescription carefully.    You can use MyChart to ask questions about todays visit, request a non-urgent call back, or ask for a work or school excuse for 24 hours related to this e-Visit. If it has been greater than 24 hours  you will need to follow up with your provider, or enter a new e-Visit to address those concerns. You will get an e-mail in the next two days asking about your experience.  I hope that your e-visit has been valuable and will speed your recovery. Thank you for using e-visits.  I have spent 5 minutes in review of e-visit questionnaire, review and updating patient  chart, medical decision making and response to patient.   Jon Belt, PhD, FNP-BC

## 2024-11-12 ENCOUNTER — Ambulatory Visit: Payer: Self-pay

## 2024-11-12 ENCOUNTER — Ambulatory Visit

## 2024-12-04 ENCOUNTER — Encounter: Admitting: Family

## 2024-12-11 ENCOUNTER — Encounter: Admitting: Family
